# Patient Record
Sex: Male | Born: 2000 | Race: White | Hispanic: No | Marital: Single | Smoking: Never smoker
Health system: Southern US, Community
[De-identification: ages and names within clinical notes are randomized; demographics above are authoritative.]

## PROBLEM LIST (undated history)

## (undated) DIAGNOSIS — T7840XA Allergy, unspecified, initial encounter: Secondary | ICD-10-CM

## (undated) DIAGNOSIS — J45909 Unspecified asthma, uncomplicated: Secondary | ICD-10-CM

## (undated) HISTORY — DX: Allergy, unspecified, initial encounter: T78.40XA

## (undated) HISTORY — DX: Unspecified asthma, uncomplicated: J45.909

---
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
## 2021-11-27 ENCOUNTER — Ambulatory Visit (INDEPENDENT_AMBULATORY_CARE_PROVIDER_SITE_OTHER): Payer: 59 | Admitting: Family Medicine

## 2021-11-27 ENCOUNTER — Other Ambulatory Visit: Payer: Self-pay

## 2021-11-27 VITALS — BP 123/80 | HR 77 | Ht 71.0 in | Wt 186.8 lb

## 2021-11-27 DIAGNOSIS — M545 Low back pain, unspecified: Secondary | ICD-10-CM

## 2021-11-27 DIAGNOSIS — M549 Dorsalgia, unspecified: Secondary | ICD-10-CM | POA: Insufficient documentation

## 2021-11-27 DIAGNOSIS — J301 Allergic rhinitis due to pollen: Secondary | ICD-10-CM | POA: Diagnosis not present

## 2021-11-27 MED ORDER — TIZANIDINE HCL 4 MG PO TABS: 4.0000 mg | ORAL_TABLET | Freq: Four times a day (QID) | ORAL | 0 refills | Status: DC | PRN

## 2021-11-27 MED ORDER — MELOXICAM 15 MG PO TABS: 15.0000 mg | ORAL_TABLET | Freq: Every day | ORAL | 0 refills | Status: AC

## 2021-11-27 MED ORDER — MONTELUKAST SODIUM 10 MG PO TABS: 10.0000 mg | ORAL_TABLET | Freq: Every day | ORAL | 1 refills | Status: DC

## 2021-11-27 NOTE — Progress Notes (Signed)
? ?Subjective:  ?Patient ID: Eric Smith, male    DOB: 26-Dec-2000  Age: 21 y.o. MRN: [id number] ? ?CC: ?Chief Complaint  ?Patient presents with  ? Allergies  ?  Establish care ?Patient arrives to discuss issues with allergies and back pain. Patient states she has has both issues for awhile ?  ? ? ?HPI: ? ?21 year old male presents for evaluation of the above. ? ?Patient reports ongoing/chronic issues with allergies.  He states that he has nasal congestion.  He has been using over-the-counter antihistamines intermittently without significant improvement.  Patient states that he feels like he is "immune" to them.  Worsening. ? ?Patient also reports ongoing mid to low back pain.  Has been going on for the past 3 months.  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  He states that it is quite stiff.  No medications or interventions tried.  No relieving factors.  No radicular symptoms.  No other complaints. ? ?Patient Active Problem List  ? Diagnosis Date Noted  ? Allergic rhinitis due to pollen 11/27/2021  ? Back pain 11/27/2021  ? ? ?Social Hx   ?Social History  ? ?Socioeconomic History  ? Marital status: Single  ?  Spouse name: Not on file  ? Number of children: Not on file  ? Years of education: Not on file  ? Highest education level: Not on file  ?Occupational History  ? Not on file  ?Tobacco Use  ? Smoking status: Never  ? Smokeless tobacco: Never  ?Substance and Sexual Activity  ? Alcohol use: No  ? Drug use: No  ? Sexual activity: Not on file  ?Other Topics Concern  ? Not on file  ?Social History Narrative  ? Not on file  ? ?Social Determinants of Health  ? ?Financial Resource Strain: Not on file  ?Food Insecurity: Not on file  ?Transportation Needs: Not on file  ?Physical Activity: Not on file  ?Stress: Not on file  ?Social Connections: Not on file  ? ? ?Review of Systems ?Per HPI ? ?Objective:  ?BP 123/80   Pulse 77   Ht '5\' 11"'$  (1.803 m)   Wt 186 lb 12.8 oz (84.7 kg)   SpO2 98%   BMI 26.05  kg/m?  ? ?BP/Weight 11/27/2021 12/23/2020 03/14/2020  ?Systolic BP 010 - 272  ?Diastolic BP 80 - 70  ?Wt. (Lbs) 186.8 168.2 165.8  ?BMI 26.05 - 23.12  ? ? ?Physical Exam ?Vitals and nursing note reviewed.  ?Constitutional:   ?   General: He is not in acute distress. ?   Appearance: Normal appearance. He is not ill-appearing.  ?HENT:  ?   Head: Normocephalic and atraumatic.  ?Eyes:  ?   General:     ?   Right eye: No discharge.     ?   Left eye: No discharge.  ?   Conjunctiva/sclera: Conjunctivae normal.  ?Cardiovascular:  ?   Rate and Rhythm: Normal rate and regular rhythm.  ?   Heart sounds: No murmur heard. ?Pulmonary:  ?   Effort: Pulmonary effort is normal.  ?   Breath sounds: Normal breath sounds. No wheezing, rhonchi or rales.  ?Musculoskeletal:  ?   Comments: No tenderness of thoracic or lumbar spine.  ?Neurological:  ?   Mental Status: He is alert.  ?Psychiatric:     ?   Mood and Affect: Mood normal.     ?   Behavior: Behavior normal.  ? ? ?Lab Results  ?Component Value Date  ?  WBC 7.8 10/08/2016  ? HGB 14.3 10/08/2016  ? HCT 42.0 10/08/2016  ? PLT 252 10/08/2016  ? GLUCOSE 155 (H) 10/08/2016  ? NA 141 10/08/2016  ? K 3.2 (L) 10/08/2016  ? CL 102 10/08/2016  ? CREATININE 1.00 10/08/2016  ? BUN 17 10/08/2016  ? CO2 23 10/08/2016  ? ? ? ?Assessment & Plan:  ? ?Problem List Items Addressed This Visit   ? ?  ? Respiratory  ? Allergic rhinitis due to pollen - Primary  ?  Chronic problem.  Recent worsening/exacerbation.  Advised daily use of antihistamine.  Can use antihistamine with decongestant. ?Adding Singulair. ?  ?  ?  ? Other  ? Back pain  ?  This is due to prolonged sitting and posture.  Treating with meloxicam and Zanaflex. ?  ?  ? Relevant Medications  ? meloxicam (MOBIC) 15 MG tablet  ? tiZANidine (ZANAFLEX) 4 MG tablet  ? ? ?Meds ordered this encounter  ?Medications  ? montelukast (SINGULAIR) 10 MG tablet  ?  Sig: Take 1 tablet (10 mg total) by mouth at bedtime.  ?  Dispense:  90 tablet  ?  Refill:  1  ?  meloxicam (MOBIC) 15 MG tablet  ?  Sig: Take 1 tablet (15 mg total) by mouth daily.  ?  Dispense:  30 tablet  ?  Refill:  0  ? tiZANidine (ZANAFLEX) 4 MG tablet  ?  Sig: Take 1 tablet (4 mg total) by mouth every 6 (six) hours as needed for muscle spasms.  ?  Dispense:  30 tablet  ?  Refill:  0  ? ? ?Thersa Salt DO ?[place] ? ?

## 2021-11-27 NOTE — Patient Instructions (Signed)
Claritin D/Allegra D/or Zyrtec D. ? ?Singulair as prescribed. ? ?Use meloxicam as needed for pain.  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  You can use this in the evening if needed ? ?Follow up annually or sooner if needed.  ? ?Take care ? ?Dr. Lacinda Axon  ?

## 2021-11-27 NOTE — Assessment & Plan Note (Signed)
This is due to prolonged sitting and posture.  Treating with meloxicam and Zanaflex. ?

## 2021-11-27 NOTE — Assessment & Plan Note (Addendum)
Chronic problem.  Recent worsening/exacerbation.  Advised daily use of antihistamine.  Can use antihistamine with decongestant. ?Adding Singulair. ?

## 2022-07-05 ENCOUNTER — Ambulatory Visit
Admission: EM | Admit: 2022-07-05 | Discharge: 2022-07-05 | Disposition: A | Discharge status: Home / Self Care | Payer: PRIVATE HEALTH INSURANCE

## 2022-07-05 DIAGNOSIS — L237 Allergic contact dermatitis due to plants, except food: Secondary | ICD-10-CM | POA: Diagnosis not present

## 2022-07-05 MED ORDER — TRIAMCINOLONE ACETONIDE 0.1 % EX OINT: 1.0000 | TOPICAL_OINTMENT | Freq: Two times a day (BID) | CUTANEOUS | 0 refills | Status: DC

## 2022-07-05 MED ORDER — DEXAMETHASONE SODIUM PHOSPHATE 10 MG/ML IJ SOLN
10.0000 mg | Freq: Once | INTRAMUSCULAR | Status: AC
  Administered 2022-07-05: 10 mg via INTRAMUSCULAR

## 2022-07-05 NOTE — Discharge Instructions (Addendum)
We have given you a shot of steroid today that will stay in your system for a few days and help with inflammation  In the meantime, please use the topical ointment twice daily to the itchy areas.  Please also resume the Allegra daily to help with itching. In addition, before bed, you can take Benadryl to help with itching while you sleep

## 2022-07-05 NOTE — ED Provider Notes (Signed)
[facility]    CSN: [id number] Arrival date & time: 07/05/22  4098      History   Chief Complaint Chief Complaint  Patient presents with   Rash    HPI Eric Smith is a 21 y.o. male.   Patient presents for rash to bilateral forearms, behind right ear, right side of face, and left inner thigh that started late last week.  Reports he had recently been in contact with poison ivy or poison oak.  He denies fever, nausea/vomiting, difficulty swallowing or breathing, scratchy or itchy throat.  No new soaps, detergents, personal care products.  No new muscle pain or joint aches.  He has tried over-the-counter Ivy dry spray with minimal relief.    Past Medical History:  Diagnosis Date   Allergy    Asthma     Patient Active Problem List   Diagnosis Date Noted   Allergic rhinitis due to pollen 11/27/2021   Back pain 11/27/2021    Past Surgical History:  Procedure Laterality Date   I & D EXTREMITY Bilateral 10/08/2016   Procedure: IRRIGATION AND DEBRIDEMENT WITH REPAIR AS NECESSARY RIGHT AND LEFT HAND;  Surgeon: Roseanne Kaufman, MD;  Location: [facility];  Service: Orthopedics;  Laterality: Bilateral;       Home Medications    Prior to Admission medications   Medication Sig Start Date End Date Taking? Authorizing Provider  fexofenadine (ALLEGRA) 180 MG tablet Take 180 mg by mouth daily.   Yes [provider]  triamcinolone ointment (KENALOG) 0.1 % Apply 1 Application topically 2 (two) times daily. Apply sparingly twice daily to affected areas 07/05/22  Yes Noemi Chapel A, NP  albuterol (VENTOLIN HFA) 108 (90 Base) MCG/ACT inhaler Inhale 2 puffs into the lungs every 6 (six) hours as needed for wheezing or shortness of breath. Patient not taking: Reported on 12/23/2020 02/18/20   Elvia Collum M, DO  desloratadine (CLARINEX) 5 MG tablet Take 1 tablet (5 mg total) by mouth daily. Patient not taking: Reported on 11/27/2021 12/23/20   Chalmers Guest, FNP   fluticasone [facility]) 50 MCG/ACT nasal spray Place 2 sprays into both nostrils daily. Patient not taking: Reported on 11/27/2021 12/23/20   Chalmers Guest, FNP  meloxicam (MOBIC) 15 MG tablet Take 1 tablet (15 mg total) by mouth daily. 11/27/21   Coral Spikes, DO  montelukast (SINGULAIR) 10 MG tablet Take 1 tablet (10 mg total) by mouth at bedtime. 11/27/21   Coral Spikes, DO  tiZANidine (ZANAFLEX) 4 MG tablet Take 1 tablet (4 mg total) by mouth every 6 (six) hours as needed for muscle spasms. 11/27/21   Coral Spikes, DO    Family History Family History  Problem Relation Age of Onset   Cancer Maternal Aunt    Cancer Maternal Uncle    Cancer Maternal Grandmother    Cancer Maternal Grandfather    Diabetes Paternal Grandmother    Cancer Mother     Social History Social History   Tobacco Use   Smoking status: Never   Smokeless tobacco: Never  Vaping Use   Vaping Use: Never used  Substance Use Topics   Alcohol use: Never   Drug use: Never     Allergies   Codeine and Hydrocodone   Review of Systems Review of Systems Per HPI  Physical Exam Triage Vital Signs ED Triage Vitals [07/05/22 0903]  Enc Vitals Group     BP 130/78     Pulse Rate 77  Resp 17     Temp 98 F (36.7 C)     Temp Source Oral     SpO2 98 %     Weight      Height      Head Circumference      Peak Flow      Pain Score 0     Pain Loc      Pain Edu?      Excl. in [place]?    No data found.  Updated Vital Signs BP 130/78 (BP Location: Right Arm)   Pulse 77   Temp 98 F (36.7 C) (Oral)   Resp 17   SpO2 98%   Visual Acuity Right Eye Distance:   Left Eye Distance:   Bilateral Distance:    Right Eye Near:   Left Eye Near:    Bilateral Near:     Physical Exam Vitals and nursing note reviewed.  Constitutional:      General: He is not in acute distress.    Appearance: Normal appearance. He is not toxic-appearing.  HENT:     Mouth/Throat:     Mouth: Mucous membranes are moist.      Pharynx: Oropharynx is clear.  Pulmonary:     Effort: Pulmonary effort is normal. No respiratory distress.  Skin:    General: Skin is warm and dry.     Capillary Refill: Capillary refill takes less than 2 seconds.     Coloration: Skin is not jaundiced or pale.     Findings: Erythema and rash present. Rash is crusting and papular.          Comments: Slightly erythematous, papular rash noted to areas of body in marked areas.  Mild edema to left forearm and oozing to rash on both forearms.  No fluctuance, warmth.    Neurological:     Mental Status: He is alert and oriented to person, place, and time.  Psychiatric:        Behavior: Behavior is cooperative.      UC Treatments / Results  Labs (all labs ordered are listed, but only abnormal results are displayed) Labs Reviewed - No data to display  EKG   Radiology No results found.  Procedures Procedures (including critical care time)  Medications Ordered in UC Medications  dexamethasone (DECADRON) injection 10 mg (10 mg Intramuscular Given 07/05/22 0931)    Initial Impression / Assessment and Plan / UC Course  I have reviewed the triage vital signs and the nursing notes.  Pertinent labs & imaging results that were available during my care of the patient were reviewed by me and considered in my medical decision making (see chart for details).   Patient is well-appearing, normotensive, afebrile, not tachycardic, not tachypneic, oxygenating well on room air.    Allergic contact dermatitis due to plants, except food Suspect contact with poison ivy, oak, or sumac Supportive care discussed Treat with Decadron IM 10 mg in urgent care given widespread nature of rash - patient elected instead of oral prednisone  Start topical corticosteroid ointment twice daily  Note given for work ER and return precautions discussed  The patient was given the opportunity to ask questions.  All questions answered to their satisfaction.  The  patient is in agreement to this plan.    Final Clinical Impressions(s) / UC Diagnoses   Final diagnoses:  Allergic contact dermatitis due to plants, except food     Discharge Instructions      We have given you a shot  of steroid today that will stay in your system for a few days and help with inflammation  In the meantime, please use the topical ointment twice daily to the itchy areas.  Please also resume the Allegra daily to help with itching. In addition, before bed, you can take Benadryl to help with itching while you sleep     ED Prescriptions     Medication Sig Dispense Auth. Provider   triamcinolone ointment (KENALOG) 0.1 % Apply 1 Application topically 2 (two) times daily. Apply sparingly twice daily to affected areas 30 g Eulogio Bear, NP      PDMP not reviewed this encounter.   Eulogio Bear, NP 07/05/22 1024

## 2022-07-05 NOTE — ED Triage Notes (Signed)
Pt reports itching rash in arms, forehead x 2 days; itching rash in left upper eyelids since this morning. Pt reports rash started after contact with poison ivy/oak. Ivy Dry spray gives some relief.

## 2022-12-24 ENCOUNTER — Ambulatory Visit (INDEPENDENT_AMBULATORY_CARE_PROVIDER_SITE_OTHER): Payer: 59 | Admitting: Family Medicine

## 2022-12-24 ENCOUNTER — Encounter: Payer: Self-pay | Admitting: Family Medicine

## 2022-12-24 VITALS — BP 114/70 | HR 72 | Temp 97.5°F | Ht 71.0 in | Wt 188.0 lb

## 2022-12-24 DIAGNOSIS — K219 Gastro-esophageal reflux disease without esophagitis: Secondary | ICD-10-CM | POA: Diagnosis not present

## 2022-12-24 DIAGNOSIS — H1013 Acute atopic conjunctivitis, bilateral: Secondary | ICD-10-CM | POA: Diagnosis not present

## 2022-12-24 MED ORDER — OLOPATADINE HCL 0.2 % OP SOLN: OPHTHALMIC | 0 refills | Status: AC

## 2022-12-24 MED ORDER — PANTOPRAZOLE SODIUM 40 MG PO TBEC: 40.0000 mg | DELAYED_RELEASE_TABLET | Freq: Every day | ORAL | 3 refills | Status: DC

## 2022-12-24 MED ORDER — CIPROFLOXACIN HCL 0.3 % OP SOLN: 1.0000 [drp] | Freq: Four times a day (QID) | OPHTHALMIC | 0 refills | Status: AC

## 2022-12-24 NOTE — Progress Notes (Unsigned)
Subjective:  Patient ID: Eric Smith, male    DOB: [birth year]  Age: 22 y.o. MRN: [id number]  CC: Chief Complaint  Patient presents with   eye redness    Itchy eye, w clear drainage, runny nose since sunday   Gastroesophageal Reflux    HPI:  22 year old male presents for evaluation of the above.  Patient reports that he developed symptoms Tuesday evening.  He said itchy watery eyes with associated redness and draining.  He had accompanying runny nose as well.  His symptoms are currently improved.  Patient also reports that he has been suffering from acid reflux.  He took his father's Protonix with improvement.  He states that he is having symptoms quite often.  Heartburn and regurgitation.  Patient Active Problem List   Diagnosis Date Noted   Allergic rhinitis due to pollen 11/27/2021   Back pain 11/27/2021    Social Hx   Social History   Socioeconomic History   Marital status: Single    Spouse name: Not on file   Number of children: Not on file   Years of education: Not on file   Highest education level: Not on file  Occupational History   Not on file  Tobacco Use   Smoking status: Never   Smokeless tobacco: Never  Vaping Use   Vaping Use: Never used  Substance and Sexual Activity   Alcohol use: Never   Drug use: Never   Sexual activity: Never  Other Topics Concern   Not on file  Social History Narrative   Not on file   Social Determinants of Health   Financial Resource Strain: Not on file  Food Insecurity: Not on file  Transportation Needs: Not on file  Physical Activity: Not on file  Stress: Not on file  Social Connections: Not on file    Review of Systems Per HPI  Objective:  BP 114/70   Pulse 72   Temp (!) 97.5 F (36.4 C)   Ht  (1.803 m)   Wt 188 lb (85.3 kg)   SpO2 98%   BMI 26.22 kg/m      12/24/2022    3:32 PM 07/05/2022    9:03 AM 11/27/2021    8:55 AM  BP/Weight  Systolic BP 114 130 123  Diastolic BP 70 78 80  Wt.  (Lbs) 188  186.8  BMI 26.22 kg/m2  26.05 kg/m2    Physical Exam Vitals and nursing note reviewed.  Constitutional:      General: He is not in acute distress.    Appearance: Normal appearance.  HENT:     Head: Normocephalic and atraumatic.  Eyes:     Comments: Mild conjunctival injection.   Cardiovascular:     Rate and Rhythm: Normal rate and regular rhythm.  Pulmonary:     Effort: Pulmonary effort is normal.     Breath sounds: Normal breath sounds. No wheezing or rales.  Abdominal:     General: There is no distension.     Palpations: Abdomen is soft.     Tenderness: There is no abdominal tenderness.  Neurological:     Mental Status: He is alert.     Lab Results  Component Value Date   WBC 7.8 10/08/2016   HGB 14.3 10/08/2016   HCT 42.0 10/08/2016   PLT 252 10/08/2016   GLUCOSE 155 (H) 10/08/2016   NA 141 10/08/2016   K 3.2 (L) 10/08/2016   CL 102 10/08/2016   CREATININE 1.00 10/08/2016  BUN 17 10/08/2016   CO2 23 10/08/2016     Assessment & Plan:   Problem List Items Addressed This Visit   None   Meds ordered this encounter  Medications   pantoprazole (PROTONIX) 40 MG tablet    Sig: Take 1 tablet (40 mg total) by mouth daily.    Dispense:  30 tablet    Refill:  3   Olopatadine HCl (PATADAY) 0.2 % SOLN    Sig: 1 drop to the affected eye(s) daily.    Dispense:  2.5 mL    Refill:  0   ciprofloxacin (CILOXAN) 0.3 % ophthalmic solution    Sig: Place 1 drop into both eyes every 6 (six) hours for 5 days.    Dispense:  5 mL    Refill:  0    Follow-up:  No follow-ups on file.  Everlene Other DO [facility]

## 2022-12-24 NOTE — Patient Instructions (Signed)
Medications as prescribed. ° °Take care ° °Dr. Anthonella Klausner  °

## 2022-12-26 DIAGNOSIS — H1010 Acute atopic conjunctivitis, unspecified eye: Secondary | ICD-10-CM | POA: Insufficient documentation

## 2022-12-26 DIAGNOSIS — K219 Gastro-esophageal reflux disease without esophagitis: Secondary | ICD-10-CM | POA: Insufficient documentation

## 2022-12-26 NOTE — Assessment & Plan Note (Signed)
Treating with Protonix. 

## 2022-12-26 NOTE — Assessment & Plan Note (Signed)
Starting pataday. If he develops purulent discharge he can start the Cipro drops.

## 2023-06-01 ENCOUNTER — Other Ambulatory Visit: Payer: Self-pay

## 2023-06-01 ENCOUNTER — Ambulatory Visit: Payer: 59

## 2023-06-01 DIAGNOSIS — Z111 Encounter for screening for respiratory tuberculosis: Secondary | ICD-10-CM

## 2023-06-06 LAB — QUANTIFERON-TB GOLD PLUS
QuantiFERON Nil Value: 0.02 [IU]/mL
QuantiFERON TB1 Ag Value: 0.03 [IU]/mL
QuantiFERON TB2 Ag Value: 0.04 IU/mL
QuantiFERON-TB Gold Plus: NEGATIVE

## 2023-06-10 ENCOUNTER — Other Ambulatory Visit: Payer: Self-pay | Admitting: Family Medicine

## 2023-06-13 ENCOUNTER — Other Ambulatory Visit: Payer: Self-pay | Admitting: Family Medicine

## 2023-06-13 ENCOUNTER — Ambulatory Visit: Payer: Self-pay

## 2023-06-13 DIAGNOSIS — S93402A Sprain of unspecified ligament of left ankle, initial encounter: Secondary | ICD-10-CM
